# Patient Record
Sex: Female | Born: 1990 | Race: White | Hispanic: No | Marital: Single | Smoking: Never smoker
Health system: Southern US, Community
[De-identification: ages and names within clinical notes are randomized; demographics above are authoritative.]

---

## 2014-08-20 ENCOUNTER — Encounter (HOSPITAL_COMMUNITY): Payer: Self-pay

## 2014-08-20 DIAGNOSIS — Z3202 Encounter for pregnancy test, result negative: Secondary | ICD-10-CM | POA: Insufficient documentation

## 2014-08-20 DIAGNOSIS — R1011 Right upper quadrant pain: Secondary | ICD-10-CM | POA: Insufficient documentation

## 2014-08-20 NOTE — ED Notes (Signed)
Pt presents with RUQ pain starting Friday that has now traveled to RLQ region. Pt denies nausea, vomiting, diarrhea, vaginal odor, discharge, bleeding or burning with urination. Pt states she is able to eat and drink with no complications, last BM was this am

## 2014-08-21 ENCOUNTER — Emergency Department (HOSPITAL_COMMUNITY)
Admission: EM | Admit: 2014-08-21 | Discharge: 2014-08-21 | Disposition: A | Discharge status: Home / Self Care | Payer: BC Managed Care – PPO | Attending: Emergency Medicine | Admitting: Emergency Medicine

## 2014-08-21 ENCOUNTER — Emergency Department (HOSPITAL_COMMUNITY): Payer: BC Managed Care – PPO

## 2014-08-21 DIAGNOSIS — R109 Unspecified abdominal pain: Secondary | ICD-10-CM

## 2014-08-21 LAB — CBC WITH DIFFERENTIAL/PLATELET
Basophils Absolute: 0 10*3/uL (ref 0.0–0.1)
Basophils Relative: 0 % (ref 0–1)
EOS PCT: 2 % (ref 0–5)
Eosinophils Absolute: 0.2 10*3/uL (ref 0.0–0.7)
HCT: 40 % (ref 36.0–46.0)
Hemoglobin: 13.2 g/dL (ref 12.0–15.0)
LYMPHS ABS: 2.4 10*3/uL (ref 0.7–4.0)
LYMPHS PCT: 30 % (ref 12–46)
MCH: 30.2 pg (ref 26.0–34.0)
MCHC: 33 g/dL (ref 30.0–36.0)
MCV: 91.5 fL (ref 78.0–100.0)
Monocytes Absolute: 0.8 10*3/uL (ref 0.1–1.0)
Monocytes Relative: 10 % (ref 3–12)
NEUTROS ABS: 4.7 10*3/uL (ref 1.7–7.7)
Neutrophils Relative %: 58 % (ref 43–77)
Platelets: 311 10*3/uL (ref 150–400)
RBC: 4.37 MIL/uL (ref 3.87–5.11)
RDW: 12.4 % (ref 11.5–15.5)
WBC: 8.1 10*3/uL (ref 4.0–10.5)

## 2014-08-21 LAB — URINALYSIS, ROUTINE W REFLEX MICROSCOPIC
BILIRUBIN URINE: NEGATIVE
Glucose, UA: NEGATIVE mg/dL
HGB URINE DIPSTICK: NEGATIVE
KETONES UR: 15 mg/dL — AB
NITRITE: NEGATIVE
PH: 6 (ref 5.0–8.0)
Protein, ur: NEGATIVE mg/dL
SPECIFIC GRAVITY, URINE: 1.024 (ref 1.005–1.030)
Urobilinogen, UA: 0.2 mg/dL (ref 0.0–1.0)

## 2014-08-21 LAB — URINE MICROSCOPIC-ADD ON

## 2014-08-21 LAB — PREGNANCY, URINE: PREG TEST UR: NEGATIVE

## 2014-08-21 LAB — COMPREHENSIVE METABOLIC PANEL
ALBUMIN: 4 g/dL (ref 3.5–5.2)
ALK PHOS: 76 U/L (ref 39–117)
ALT: 9 U/L (ref 0–35)
AST: 14 U/L (ref 0–37)
Anion gap: 17 — ABNORMAL HIGH (ref 5–15)
BILIRUBIN TOTAL: 0.3 mg/dL (ref 0.3–1.2)
BUN: 10 mg/dL (ref 6–23)
CHLORIDE: 100 meq/L (ref 96–112)
CO2: 23 meq/L (ref 19–32)
CREATININE: 0.63 mg/dL (ref 0.50–1.10)
Calcium: 9.2 mg/dL (ref 8.4–10.5)
GFR calc Af Amer: >90 mL/min (ref >90)
GFR calc non Af Amer: >90 mL/min (ref >90)
Glucose, Bld: 104 mg/dL — ABNORMAL HIGH (ref 70–99)
Potassium: 3.4 mEq/L — ABNORMAL LOW (ref 3.7–5.3)
Sodium: 140 mEq/L (ref 137–147)
Total Protein: 8 g/dL (ref 6.0–8.3)

## 2014-08-21 NOTE — ED Provider Notes (Signed)
CSN: [id number]     Arrival date & time 08/20/14  2327 History   First MD Initiated Contact with Patient 08/21/14 0037     Chief Complaint  Patient presents with  . Abdominal Pain     (Consider location/radiation/quality/duration/timing/severity/associated sxs/prior Treatment) Patient is a 23 y.o. female presenting with abdominal pain. The history is provided by the patient. No language interpreter was used.  Abdominal Pain Pain location:  RUQ Associated symptoms: no chills and no fever   Associated symptoms comment:  RUQ pain for the past 3 days. It is worse with deep breathing (no SOB, cough or fever). She also feels the discomfort more when walking. The pain goes away with rest. No nausea, vomiting, diarrhea, fever. It is not affected by food. No vaginal discharge, dysuria or back pain.   History reviewed. No pertinent past medical history. History reviewed. No pertinent past surgical history. History reviewed. No pertinent family history. History  Substance Use Topics  . Smoking status: Never Smoker   . Smokeless tobacco: Not on file  . Alcohol Use: Yes     Comment: occasional    OB History    No data available     Review of Systems  Constitutional: Negative for fever and chills.  Respiratory: Negative.        See HPI.  Cardiovascular: Negative.   Gastrointestinal: Positive for abdominal pain.  Genitourinary: Negative.   Musculoskeletal: Negative.   Skin: Negative.   Neurological: Negative.       Allergies  Codeine  Home Medications   Prior to Admission medications   Medication Sig Start Date End Date Taking? Authorizing Provider  ibuprofen (ADVIL,MOTRIN) 200 MG tablet Take 400 mg by mouth every 6 (six) hours as needed for moderate pain.   Yes Historical Provider, MD   BP 129/72 mmHg  Pulse 85  Temp(Src) 98.3 F (36.8 C) (Oral)  Resp 18  Ht 5\' 4"  (1.626 m)  Wt 119 lb 14.4 oz (54.386 kg)  BMI 20.57 kg/m2  SpO2 95%  LMP 08/06/2014 Physical Exam   Constitutional: She is oriented to person, place, and time. She appears well-developed and well-nourished.  HENT:  Head: Normocephalic.  Neck: Normal range of motion. Neck supple.  Cardiovascular: Normal rate and regular rhythm.   Pulmonary/Chest: Effort normal and breath sounds normal.  Abdominal: Soft. Bowel sounds are normal. There is no tenderness. There is no rebound and no guarding.  No TTP, specifically, no RUQ tenderness.   Musculoskeletal: Normal range of motion.  Neurological: She is alert and oriented to person, place, and time.  Skin: Skin is warm and dry. No rash noted.  Psychiatric: She has a normal mood and affect.    ED Course  Procedures (including critical care time) Labs Review Labs Reviewed  COMPREHENSIVE METABOLIC PANEL - Abnormal; Notable for the following:    Potassium 3.4 (*)    Glucose, Bld 104 (*)    Anion gap 17 (*)    All other components within normal limits  URINALYSIS, ROUTINE W REFLEX MICROSCOPIC - Abnormal; Notable for the following:    APPearance CLOUDY (*)    Ketones, ur 15 (*)    Leukocytes, UA SMALL (*)    All other components within normal limits  URINE MICROSCOPIC-ADD ON - Abnormal; Notable for the following:    Bacteria, UA FEW (*)    All other components within normal limits  CBC WITH DIFFERENTIAL  PREGNANCY, URINE    Imaging Review No results found.   EKG Interpretation None  MDM   Final diagnoses:  Abdominal pain    No lab abnormalities, fever, current tenderness, other GI symptoms in patient with pain that is worse with movement. Suspect muscular abdominal wall pain. Return precautions given. Stable for discharge.     Arnoldo HookerShari A Shavonta Gossen, PA-C 08/21/14 2149  Tomasita CrumbleAdeleke Oni, MD 08/22/14 [phone]

## 2014-08-21 NOTE — Discharge Instructions (Signed)
TREAT ABDOMINAL DISCOMFORT AS MUSCULAR PAIN WITH IBUPROFEN OR TYLENOL, AND WARM COMPRESSES. RETURN HERE WITH ANY WORSENING OR NEW SYMPTOMS - HIGH FEVER, SEVERE PAIN, NAUSEA/VOMITING OR OTHER CONCERN.

## 2016-11-21 IMAGING — DX DG ABDOMEN ACUTE W/ 1V CHEST
3 series · 3 of 3 positions shown · non-contrast
Comparison: None.

CLINICAL DATA: Abdominal pain.

EXAM:
ACUTE ABDOMEN SERIES (ABDOMEN 2 VIEW & CHEST 1 VIEW)

[chest pa]
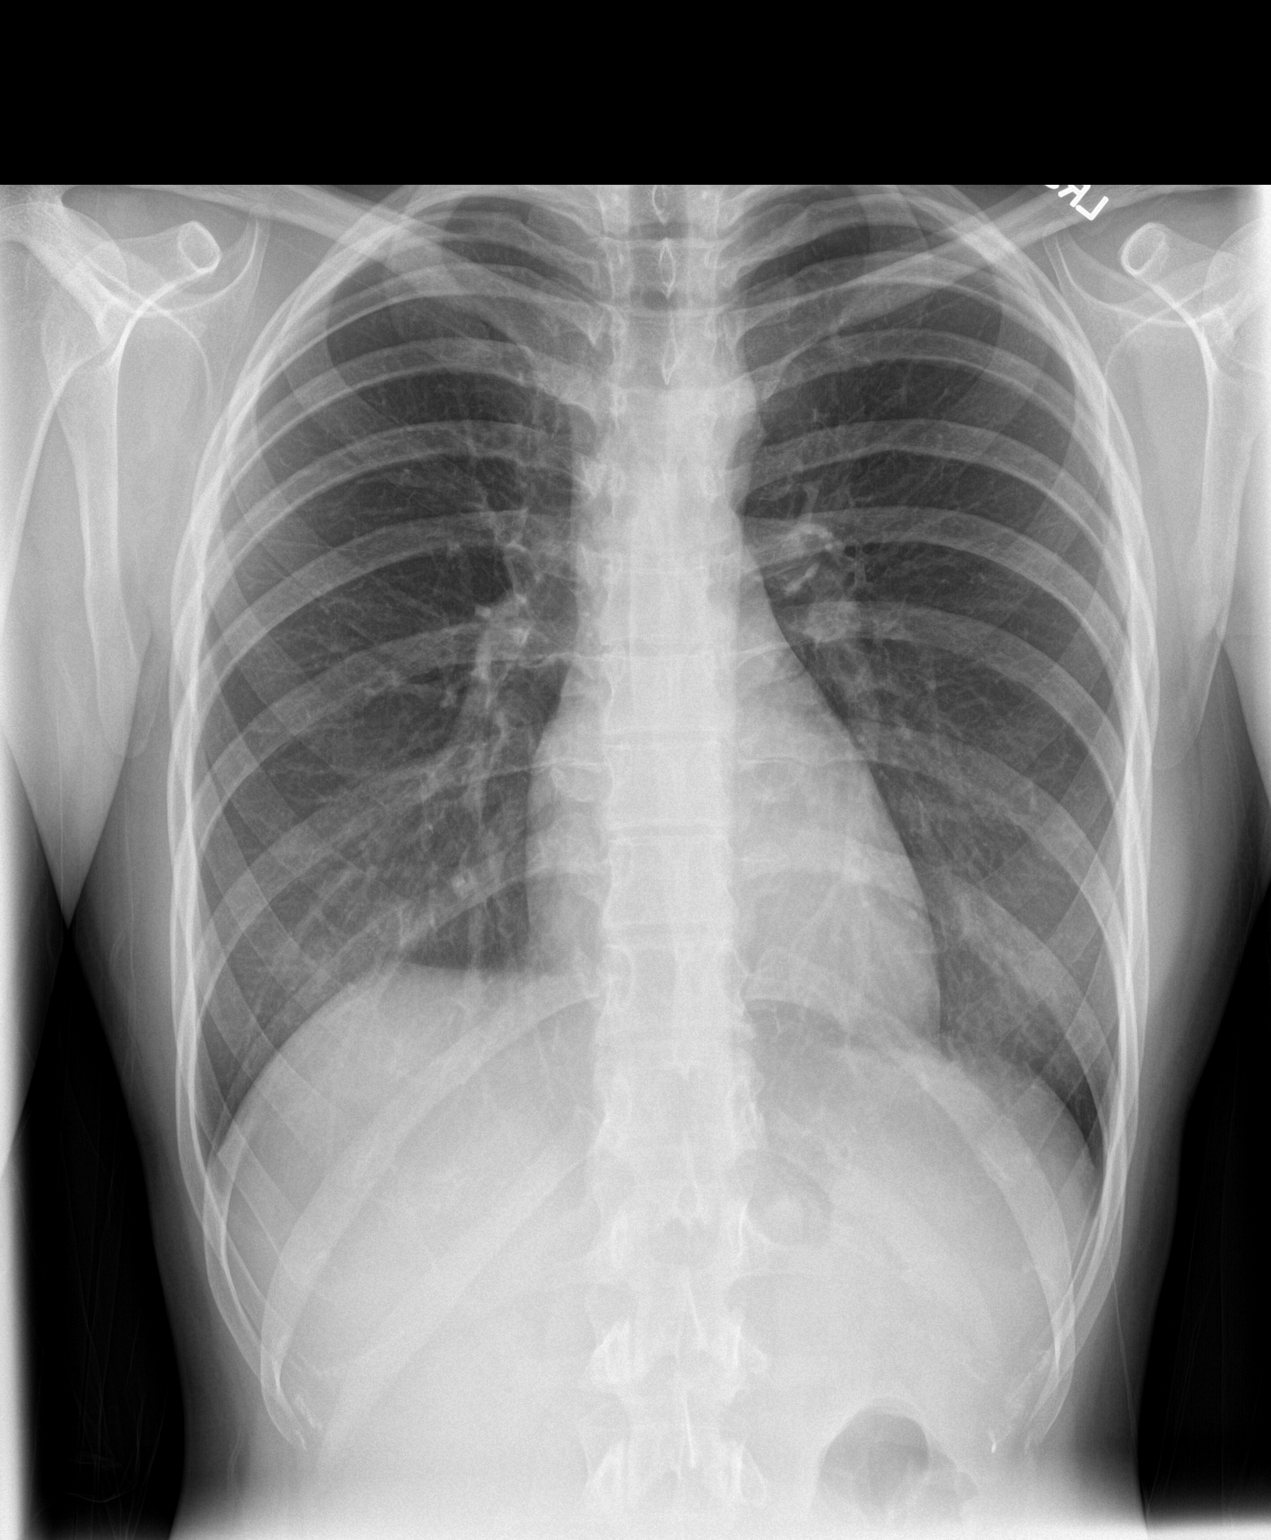

[abdomen erect]
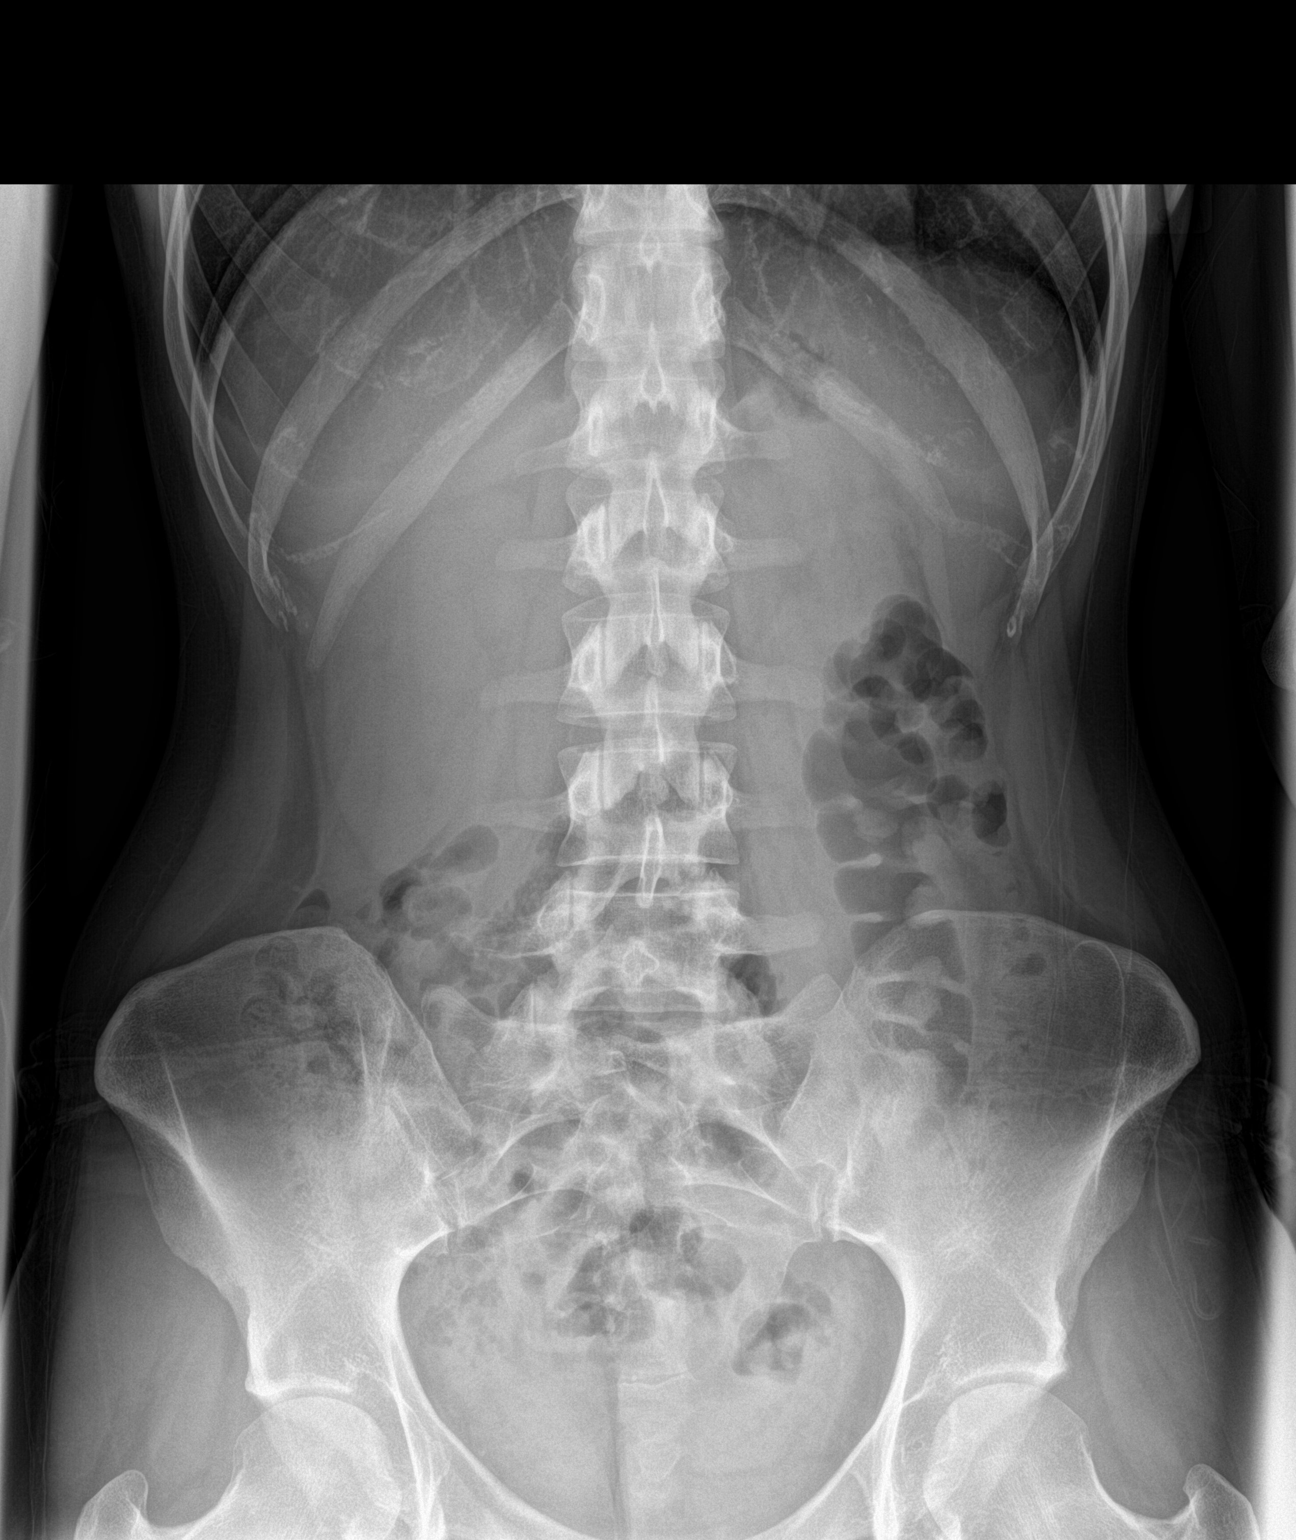

[abdomen supine]
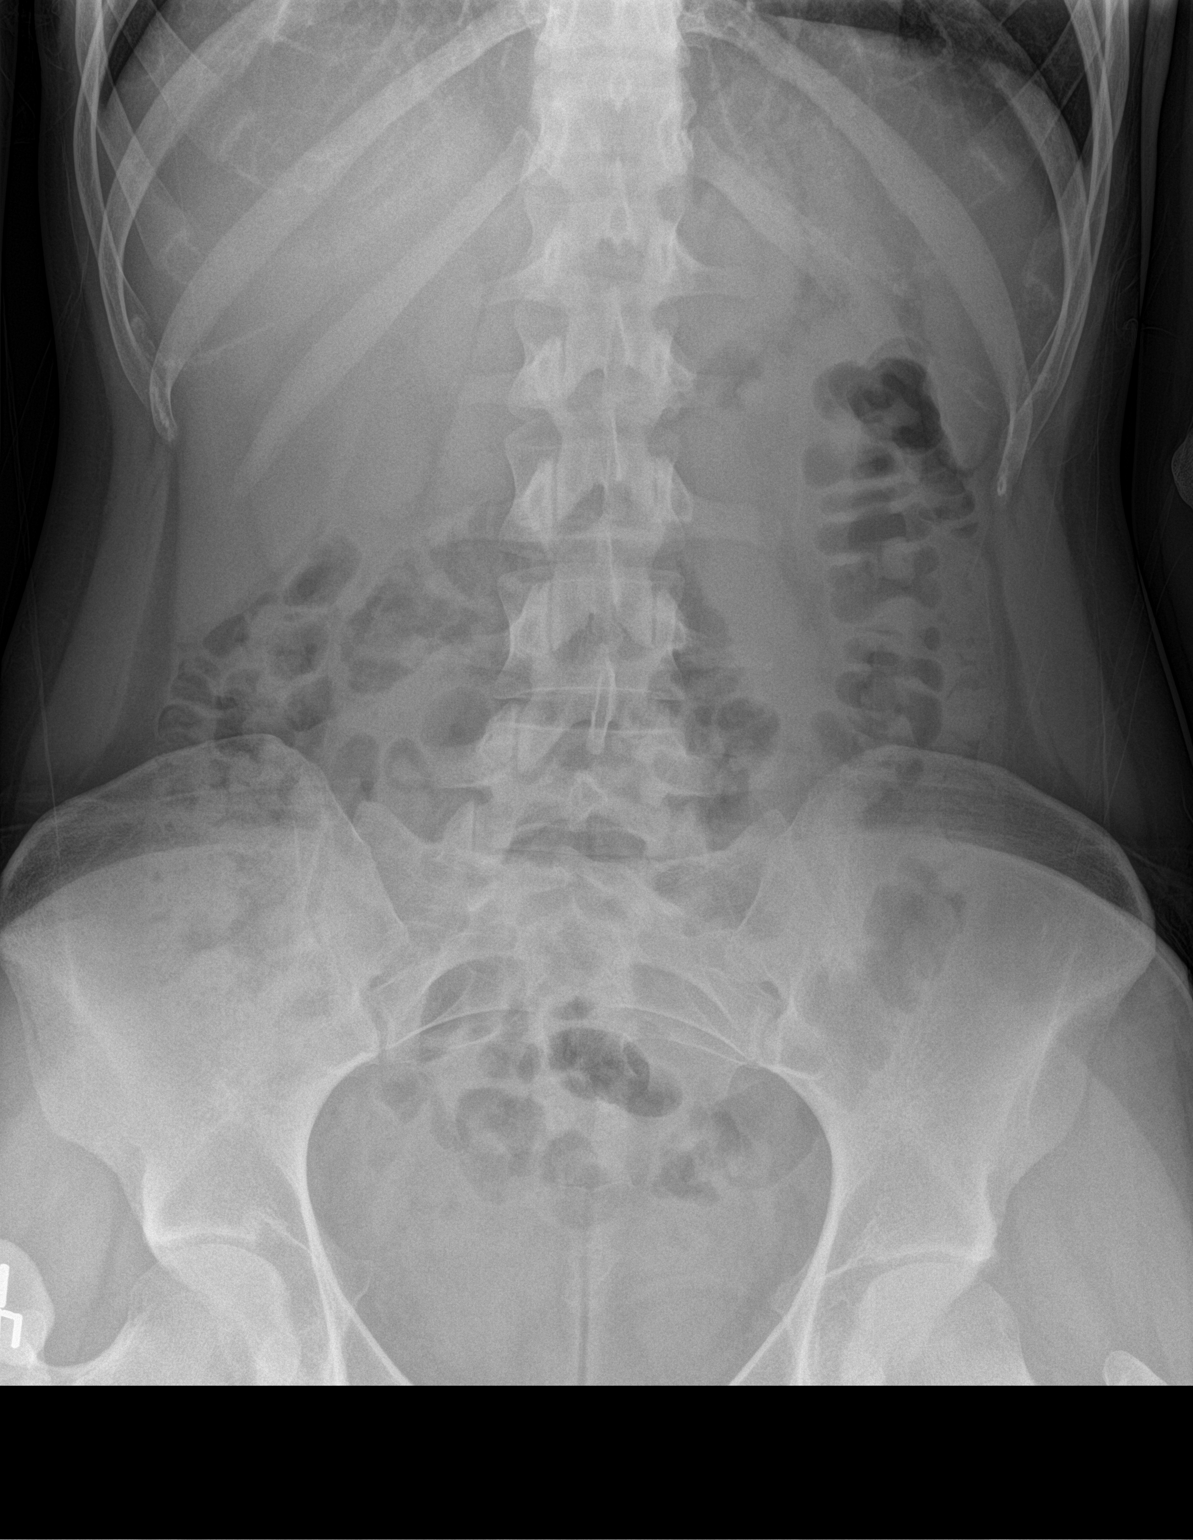

[3 of 3 positions shown; findings below may reference images not displayed]

FINDINGS: There is no evidence of dilated bowel loops or free intraperitoneal
air. No radiopaque calculi or other significant radiographic
abnormality is seen. Heart size and mediastinal contours are within
normal limits. Both lungs are clear.
IMPRESSION: Negative abdominal radiographs.  No acute cardiopulmonary disease.

## 2019-12-23 ENCOUNTER — Ambulatory Visit: Payer: Self-pay | Attending: Internal Medicine

## 2019-12-23 DIAGNOSIS — Z23 Encounter for immunization: Secondary | ICD-10-CM

## 2019-12-23 NOTE — Progress Notes (Signed)
   Covid-19 Vaccination Clinic  Name:  Yamili Lichtenwalner    MRN: [id number] DOB: Jul 22, 1991  12/23/2019  Ms. Mangus was observed post Covid-19 immunization for 15 minutes without incident. She was provided with Vaccine Information Sheet and instruction to access the V-Safe system.   Ms. Fishburn was instructed to call 911 with any severe reactions post vaccine: Marland Kitchen Difficulty breathing  . Swelling of face and throat  . A fast heartbeat  . A bad rash all over body  . Dizziness and weakness   Immunizations Administered    Name Date Dose VIS Date Route   Pfizer COVID-19 Vaccine 12/23/2019  4:19 PM 0.3 mL 09/09/2019 Intramuscular   Manufacturer: [employer], [employer]   Lot: [id number]   NDC: [address]-4

## 2020-01-18 ENCOUNTER — Ambulatory Visit: Payer: Self-pay | Attending: Internal Medicine

## 2020-01-18 DIAGNOSIS — Z23 Encounter for immunization: Secondary | ICD-10-CM

## 2020-01-18 NOTE — Progress Notes (Signed)
   Covid-19 Vaccination Clinic  Name:  Maureen Mcclain    MRN: [id number] DOB: 07-13-1991  01/18/2020  [name] was observed post Covid-19 immunization for 15 minutes without incident. She was provided with Vaccine Information Sheet and instruction to access the V-Safe system.   [name] was instructed to call 911 with any severe reactions post vaccine: Marland Kitchen Difficulty breathing  . Swelling of face and throat  . A fast heartbeat  . A bad rash all over body  . Dizziness and weakness   Immunizations Administered    Name Date Dose VIS Date Route   Pfizer COVID-19 Vaccine 01/18/2020  4:06 PM 0.3 mL 11/23/2018 Intramuscular   Manufacturer: [employer], [employer]   Lot: [id number]   NDC: [address]-4
